# Patient Record
Sex: Male | Born: 1960 | Race: White | Hispanic: No | Marital: Married | Smoking: Never smoker
Health system: Southern US, Community
[De-identification: ages and names within clinical notes are randomized; demographics above are authoritative.]

## PROBLEM LIST (undated history)

## (undated) DIAGNOSIS — IMO0002 Reserved for concepts with insufficient information to code with codable children: Secondary | ICD-10-CM

## (undated) DIAGNOSIS — Z9109 Other allergy status, other than to drugs and biological substances: Secondary | ICD-10-CM

## (undated) DIAGNOSIS — K219 Gastro-esophageal reflux disease without esophagitis: Secondary | ICD-10-CM

## (undated) DIAGNOSIS — E785 Hyperlipidemia, unspecified: Secondary | ICD-10-CM

## (undated) DIAGNOSIS — J189 Pneumonia, unspecified organism: Secondary | ICD-10-CM

## (undated) HISTORY — DX: Hyperlipidemia, unspecified: E78.5

## (undated) HISTORY — PX: ELBOW SURGERY: SHX618

## (undated) HISTORY — DX: Gastro-esophageal reflux disease without esophagitis: K21.9

## (undated) HISTORY — DX: Other allergy status, other than to drugs and biological substances: Z91.09

## (undated) HISTORY — PX: INCISE AND DRAIN ABCESS: PRO64

## (undated) HISTORY — PX: CYSTECTOMY: SUR359

## (undated) HISTORY — DX: Reserved for concepts with insufficient information to code with codable children: IMO0002

---

## 2008-02-21 ENCOUNTER — Ambulatory Visit (HOSPITAL_COMMUNITY): Admission: RE | Admit: 2008-02-21 | Discharge: 2008-02-21 | Payer: Self-pay | Admitting: Internal Medicine

## 2008-05-01 ENCOUNTER — Encounter: Admission: RE | Admit: 2008-05-01 | Discharge: 2008-05-01 | Payer: Self-pay | Admitting: Internal Medicine

## 2010-07-22 ENCOUNTER — Encounter: Payer: Self-pay | Admitting: Internal Medicine

## 2011-10-10 ENCOUNTER — Ambulatory Visit (INDEPENDENT_AMBULATORY_CARE_PROVIDER_SITE_OTHER): Payer: Managed Care, Other (non HMO) | Admitting: General Surgery

## 2011-10-10 ENCOUNTER — Encounter (INDEPENDENT_AMBULATORY_CARE_PROVIDER_SITE_OTHER): Payer: Self-pay | Admitting: General Surgery

## 2011-10-10 VITALS — BP 122/80 | HR 84 | Temp 98.2°F | Resp 12 | Ht 70.5 in | Wt 186.2 lb

## 2011-10-10 DIAGNOSIS — D215 Benign neoplasm of connective and other soft tissue of pelvis: Secondary | ICD-10-CM | POA: Insufficient documentation

## 2011-10-10 NOTE — Patient Instructions (Signed)
Call if the area becomes infected prior to your surgery.

## 2011-10-10 NOTE — Progress Notes (Signed)
Patient ID: Joel Rich, male   DOB: [birth year], 51 y.o.   MRN: [id number]  Chief Complaint  Patient presents with  . Cyst    new pt- eval Lt posterior cyst    HPI Joel Rich is a 51 y.o. male.   HPI  He is referred by Dr. Clelia Croft for evaluation of a recurrent soft tissue mass of the left buttock.  In 2005, he had a cyst removed from this area in [place], [place].  He noticed it starting to come back and recently it became infected. Dr. Clelia Croft did an incision and drainage and treated him for this. It has been getting larger.  It does not cause him pain.  Past Medical History  Diagnosis Date  . Cyst     left posterior   . Hyperlipidemia     Past Surgical History  Procedure Date  . Incise and drain abcess     cyst on buttock  . Cystectomy     buttock    Family History  Problem Relation Age of Onset  . Cancer Mother     breast  . Cancer Maternal Grandmother     breast    Social History History  Substance Use Topics  . Smoking status: Never Smoker   . Smokeless tobacco: Not on file  . Alcohol Use: Yes    No Known Allergies  Current Outpatient Prescriptions  Medication Sig Dispense Refill  . simvastatin (ZOCOR) 20 MG tablet daily.        Review of Systems Review of Systems  Respiratory: Negative.   Cardiovascular: Negative.   Musculoskeletal: Negative.   Hematological: Negative.     Blood pressure 122/80, pulse 84, temperature 98.2 F (36.8 C), temperature source Temporal, resp. rate 12, height 5' 10.5" (1.791 m), weight 186 lb 3.2 oz (84.46 kg).  Physical Exam Physical Exam  Constitutional: He appears well-developed and well-nourished. No distress.  HENT:  Head: Normocephalic and atraumatic.  Cardiovascular: Normal rate and regular rhythm.   Pulmonary/Chest: Effort normal and breath sounds normal.  Musculoskeletal:       There is a transverse left buttock scar present. In the lateral aspect of the scar there is a 5 cm x 5 cm soft tissue mass  that appears to be going at least 4-5 cm deep as well. There is no erythema or drainage from the mass. It is somewhat mobile and nontender.    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    Plan    I recommended removal of the recurrent soft tissue mass under general anesthesia as it may be quite extensive.  We discussed the procedure, risks, and aftercare.  The risks include but are not limited to bleeding, infection, wound healing problems, anesthesia, and recurrence. He seems to understand all this and agrees with the plan.       Aarib Pulido J 10/10/2011, 3:30 PM

## 2011-10-23 ENCOUNTER — Other Ambulatory Visit (INDEPENDENT_AMBULATORY_CARE_PROVIDER_SITE_OTHER): Payer: Self-pay | Admitting: General Surgery

## 2011-10-23 DIAGNOSIS — L723 Sebaceous cyst: Secondary | ICD-10-CM

## 2011-10-27 ENCOUNTER — Telehealth (INDEPENDENT_AMBULATORY_CARE_PROVIDER_SITE_OTHER): Payer: Self-pay

## 2011-10-27 NOTE — Telephone Encounter (Signed)
Pt given pathology results.  Scheduled for suture removal on 11/07/11, and po appt with Dr. Abbey Chatters on 11/28/11.

## 2011-11-07 ENCOUNTER — Ambulatory Visit (INDEPENDENT_AMBULATORY_CARE_PROVIDER_SITE_OTHER): Payer: Managed Care, Other (non HMO) | Admitting: General Surgery

## 2011-11-07 ENCOUNTER — Encounter (INDEPENDENT_AMBULATORY_CARE_PROVIDER_SITE_OTHER): Payer: Managed Care, Other (non HMO)

## 2011-11-07 DIAGNOSIS — Z9889 Other specified postprocedural states: Secondary | ICD-10-CM

## 2011-11-07 NOTE — Progress Notes (Signed)
Operation:  Excision of large soft tissue mass right buttock   Date:  October 23, 2011  Pathology: Benign inflamed epidermoid cyst  HPI: He is here for his first postoperative visit. He has had some drainage from the lower aspect of the wound. He has not had any significant pain   Physical Exam: Right buttock-wound is clean and intact with sutures. There is still some tension on the wound.  Assessment: Wound healing well but not ready to have sutures removed.  Plan: Shower and keep a dry dressing on the wound and do this daily.  Return visit one week for suture removal.

## 2011-11-07 NOTE — Patient Instructions (Signed)
Shower daily and apply a dry dressing to the wound.

## 2011-11-14 ENCOUNTER — Ambulatory Visit (INDEPENDENT_AMBULATORY_CARE_PROVIDER_SITE_OTHER): Payer: Managed Care, Other (non HMO)

## 2011-11-14 VITALS — BP 130/80 | HR 80 | Temp 98.5°F | Resp 18 | Ht 70.0 in | Wt 177.0 lb

## 2011-11-14 DIAGNOSIS — Z4802 Encounter for removal of sutures: Secondary | ICD-10-CM

## 2011-11-14 NOTE — Progress Notes (Signed)
Pt in office for suture rem. Wound clean,dry,healing well. Sutures removed. Steri strips and benzoin applied. Pt to call with any concerns.

## 2011-11-28 ENCOUNTER — Encounter (INDEPENDENT_AMBULATORY_CARE_PROVIDER_SITE_OTHER): Payer: Self-pay | Admitting: General Surgery

## 2011-11-28 ENCOUNTER — Ambulatory Visit (INDEPENDENT_AMBULATORY_CARE_PROVIDER_SITE_OTHER): Payer: Managed Care, Other (non HMO) | Admitting: General Surgery

## 2011-11-28 VITALS — BP 118/70 | HR 84 | Temp 98.0°F | Resp 18 | Ht 71.0 in | Wt 187.0 lb

## 2011-11-28 DIAGNOSIS — Z9889 Other specified postprocedural states: Secondary | ICD-10-CM

## 2011-11-28 NOTE — Progress Notes (Signed)
Operation:  Excision of large soft tissue mass right buttock   Date:  October 23, 2011  Pathology: Benign inflamed epidermoid cyst  HPI: He is here for another postop visit.  The wound feels a little firm to him.  Otherwise, he has no complaints. His sutures were removed on 5/17.     Physical Exam: Right buttock-wound is clean and intact, with a firm healing ridge present  Assessment:  Wound healing well.  Copy of his pathology was given to him demonstrating a benign epidermoid cyst was given to him.  Plan:  RTC prn.

## 2011-11-28 NOTE — Patient Instructions (Signed)
Call for any wound problems. 

## 2011-12-12 ENCOUNTER — Encounter (INDEPENDENT_AMBULATORY_CARE_PROVIDER_SITE_OTHER): Payer: Self-pay

## 2012-03-13 ENCOUNTER — Encounter (HOSPITAL_COMMUNITY): Payer: Self-pay | Admitting: *Deleted

## 2012-03-13 ENCOUNTER — Emergency Department (HOSPITAL_COMMUNITY): Payer: Managed Care, Other (non HMO)

## 2012-03-13 ENCOUNTER — Emergency Department (HOSPITAL_COMMUNITY)
Admission: EM | Admit: 2012-03-13 | Discharge: 2012-03-13 | Disposition: A | Discharge status: Home / Self Care | Payer: Managed Care, Other (non HMO) | Attending: Emergency Medicine | Admitting: Emergency Medicine

## 2012-03-13 DIAGNOSIS — Y92009 Unspecified place in unspecified non-institutional (private) residence as the place of occurrence of the external cause: Secondary | ICD-10-CM | POA: Insufficient documentation

## 2012-03-13 DIAGNOSIS — E785 Hyperlipidemia, unspecified: Secondary | ICD-10-CM | POA: Insufficient documentation

## 2012-03-13 DIAGNOSIS — X500XXA Overexertion from strenuous movement or load, initial encounter: Secondary | ICD-10-CM | POA: Insufficient documentation

## 2012-03-13 DIAGNOSIS — S82839A Other fracture of upper and lower end of unspecified fibula, initial encounter for closed fracture: Secondary | ICD-10-CM

## 2012-03-13 DIAGNOSIS — S82899A Other fracture of unspecified lower leg, initial encounter for closed fracture: Secondary | ICD-10-CM | POA: Insufficient documentation

## 2012-03-13 DIAGNOSIS — Z79899 Other long term (current) drug therapy: Secondary | ICD-10-CM | POA: Insufficient documentation

## 2012-03-13 DIAGNOSIS — Y9369 Activity, other involving other sports and athletics played as a team or group: Secondary | ICD-10-CM | POA: Insufficient documentation

## 2012-03-13 DIAGNOSIS — Z7982 Long term (current) use of aspirin: Secondary | ICD-10-CM | POA: Insufficient documentation

## 2012-03-13 MED ORDER — HYDROCODONE-ACETAMINOPHEN 5-325 MG PO TABS: 1.0000 | ORAL_TABLET | ORAL | Status: AC | PRN

## 2012-03-13 NOTE — ED Provider Notes (Signed)
History     CSN: [id number]  Arrival date & time 03/13/12  2003   First MD Initiated Contact with Patient 03/13/12 2213      Chief Complaint  Patient presents with  . Ankle Pain   HPI  History provided by the patient. Patient is a 51 year old male who presents with complaints of left ankle pain and injury. Patient states he was outside playing capture the flag with kids and other parents when he twisted left ankle. Patient states he inverted the ankle and has pain over lateral aspect. There is associated swelling. Pain is worse with pressure and walking. Patient did use an old pair of crutches to help get around. Patient denies any numbness or weakness in the foot. Denies any other injury.   Past Medical History  Diagnosis Date  . Cyst     left posterior   . Hyperlipidemia     Past Surgical History  Procedure Date  . Incise and drain abcess     cyst on buttock  . Cystectomy     buttock    Family History  Problem Relation Age of Onset  . Cancer Mother     breast  . Cancer Maternal Grandmother     breast    History  Substance Use Topics  . Smoking status: Never Smoker   . Smokeless tobacco: Not on file  . Alcohol Use: Yes      Review of Systems  HENT: Negative for neck pain.   Musculoskeletal: Negative for back pain.       Left ankle pain and swelling.  Neurological: Negative for weakness and numbness.    Allergies  Review of patient's allergies indicates no known allergies.  Home Medications   Current Outpatient Rx  Name Route Sig Dispense Refill  . ASPIRIN EC 81 MG PO TBEC Oral Take 81 mg by mouth daily.    Marland Kitchen FLUTICASONE PROPIONATE 50 MCG/ACT NA SUSP Nasal Place 1 spray into the nose daily as needed. For stuffiness (hay fever)    . ADULT MULTIVITAMIN W/MINERALS CH Oral Take 1 tablet by mouth daily. Monday thru Friday    . SIMVASTATIN 20 MG PO TABS Oral Take 20 mg by mouth daily.       BP 125/76  Pulse 78  Temp 97.8 F (36.6 C) (Oral)  Resp 18   SpO2 98%  Physical Exam  Nursing note and vitals reviewed. Constitutional: He is oriented to person, place, and time. He appears well-developed and well-nourished. No distress.  HENT:  Head: Normocephalic.  Neck: Normal range of motion.  Cardiovascular: Normal rate and regular rhythm.   Pulmonary/Chest: Effort normal and breath sounds normal. No respiratory distress. He has no wheezes. He has no rales.  Musculoskeletal: He exhibits edema and tenderness.       Reduced range of motion of left ankle secondary to pain and swelling. There is tenderness over lateral malleoli area and lateral anterior home. No tenderness over medial malleolus. No tenderness over proximal fifth metatarsal. Normal dorsal pedal pulses, movement in toes, sensation in toes and cap refill less than 2 seconds.  Neurological: He is alert and oriented to person, place, and time.  Skin: Skin is warm. No erythema.  Psychiatric: He has a normal mood and affect.    ED Course  Procedures   Dg Ankle Complete Left  03/13/2012  *RADIOLOGY REPORT*  Clinical Data: Left ankle pain and swelling following injury.  LEFT ANKLE COMPLETE - 3+ VIEW  Comparison: None  Findings: An  oblique fracture of the distal fibula is identified with 3.5 mm anterior displacement. No other fractures are identified. There is no evidence of subluxation or dislocation. The talus is unremarkable. Lateral soft tissue swelling is present.  IMPRESSION: Minimally displaced distal fibular fracture.   Original Report Authenticated By: Rosendo Gros, M.D.      1. Fracture of distal fibula       MDM  10:10PM patient seen and evaluated. X-rays with slightly displaced distal fibular fracture.  Patient discussed with attending physician. Patient placed in short posterior and stirrup leg swollen and provided crutches.      Angus Seller, [place] 03/14/12 2114

## 2012-03-13 NOTE — ED Notes (Signed)
The pt was playing outside earlier today .  He slid in the grass and twisted his lt ankle.  painful

## 2012-03-13 NOTE — Progress Notes (Signed)
Orthopedic Tech Progress Note Patient Details:  Joel Rich 1960-08-11 [id number]  Ortho Devices Type of Ortho Device: Crutches;Post (short) splint;Stirrup splint Splint Material: Fiberglass Ortho Device/Splint Location: left leg Ortho Device/Splint Interventions: Application   Joel Rich 03/13/2012, 10:54 PM

## 2012-03-15 NOTE — ED Provider Notes (Signed)
Medical screening examination/treatment/procedure(s) were performed by non-physician practitioner and as supervising physician I was immediately available for consultation/collaboration.  Tobin Chad, MD 03/15/12 [phone]

## 2013-02-28 HISTORY — PX: ANKLE FRACTURE SURGERY: SHX122

## 2014-11-17 ENCOUNTER — Ambulatory Visit (AMBULATORY_SURGERY_CENTER): Payer: Self-pay

## 2014-11-17 VITALS — Ht 70.5 in | Wt 188.8 lb

## 2014-11-17 DIAGNOSIS — Z1211 Encounter for screening for malignant neoplasm of colon: Secondary | ICD-10-CM

## 2014-11-17 MED ORDER — MOVIPREP 100 G PO SOLR: 1.0000 | Freq: Once | ORAL | Status: DC

## 2014-11-17 NOTE — Progress Notes (Signed)
No allergies to eggs or soy No diet/weight loss meds No home oxygen No past problems with anesthesia  Has email  Emmi instructions given for colonoscopy 

## 2014-12-01 ENCOUNTER — Ambulatory Visit (AMBULATORY_SURGERY_CENTER): Payer: Managed Care, Other (non HMO) | Admitting: Internal Medicine

## 2014-12-01 ENCOUNTER — Encounter: Payer: Self-pay | Admitting: Internal Medicine

## 2014-12-01 VITALS — BP 115/58 | HR 58 | Temp 97.2°F | Resp 19 | Ht 70.0 in | Wt 188.0 lb

## 2014-12-01 DIAGNOSIS — Z1211 Encounter for screening for malignant neoplasm of colon: Secondary | ICD-10-CM | POA: Diagnosis not present

## 2014-12-01 MED ORDER — SODIUM CHLORIDE 0.9 % IV SOLN: 500.0000 mL | INTRAVENOUS | Status: DC

## 2014-12-01 NOTE — Patient Instructions (Signed)
YOU HAD AN ENDOSCOPIC PROCEDURE TODAY AT [facility]:   Refer to the procedure report that was given to you for any specific questions about what was found during the examination.  If the procedure report does not answer your questions, please call your gastroenterologist to clarify.  If you requested that your care partner not be given the details of your procedure findings, then the procedure report has been included in a sealed envelope for you to review at your convenience later.  YOU SHOULD EXPECT: Some feelings of bloating in the abdomen. Passage of more gas than usual.  Walking can help get rid of the air that was put into your GI tract during the procedure and reduce the bloating. If you had a lower endoscopy (such as a colonoscopy or flexible sigmoidoscopy) you may notice spotting of blood in your stool or on the toilet paper. If you underwent a bowel prep for your procedure, you may not have a normal bowel movement for a few days.  Please Note:  You might notice some irritation and congestion in your nose or some drainage.  This is from the oxygen used during your procedure.  There is no need for concern and it should clear up in a day or so.  SYMPTOMS TO REPORT IMMEDIATELY:   Following lower endoscopy (colonoscopy or flexible sigmoidoscopy):  Excessive amounts of blood in the stool  Significant tenderness or worsening of abdominal pains  Swelling of the abdomen that is new, acute  Fever of 100F or higher   For urgent or emergent issues, a gastroenterologist can be reached at any hour by calling [phone].   DIET: Your first meal following the procedure should be a small meal and then it is ok to progress to your normal diet. Heavy or fried foods are harder to digest and may make you feel nauseous or bloated.  Likewise, meals heavy in dairy and vegetables can increase bloating.  Drink plenty of fluids but you should avoid alcoholic beverages for 24  hours.  ACTIVITY:  You should plan to take it easy for the rest of today and you should NOT DRIVE or use heavy machinery until tomorrow (because of the sedation medicines used during the test).    FOLLOW UP: Our staff will call the number listed on your records the next business day following your procedure to check on you and address any questions or concerns that you may have regarding the information given to you following your procedure. If we do not reach you, we will leave a message.  However, if you are feeling well and you are not experiencing any problems, there is no need to return our call.  We will assume that you have returned to your regular daily activities without incident.  If any biopsies were taken you will be contacted by phone or by letter within the next 1-3 weeks.  Please call us at [phone] if you have not heard about the biopsies in 3 weeks.    SIGNATURES/CONFIDENTIALITY: You and/or your care partner have signed paperwork which will be entered into your electronic medical record.  These signatures attest to the fact that that the information above on your After Visit Summary has been reviewed and is understood.  Full responsibility of the confidentiality of this discharge information lies with you and/or your care-partner.  Normal colonoscopy. Repeat colonoscopy in 10 years.

## 2014-12-01 NOTE — Op Note (Signed)
[place]  [address] [place], [id number]   COLONOSCOPY PROCEDURE REPORT  PATIENT: Joel Rich, Joel Rich  MR#: [id number] BIRTHDATE: [birth year] , 84  yrs. old GENDER: male ENDOSCOPIST: Eustace Quail, MD REFERRED BY:W.  Lutricia Feil, M.D. PROCEDURE DATE:  12/01/2014 PROCEDURE:   Colonoscopy, screening First Screening Colonoscopy - Avg.  risk and is 50 yrs.  old or older - No.  Prior Negative Screening - Now for repeat screening. 10 or more years since last screening  History of Adenoma - Now for follow-up colonoscopy & has been > or = to 3 yrs.  N/A  Polyps removed today? No Recommend repeat exam, <10 yrs? No ASA CLASS:   Class II INDICATIONS:Screening for colonic neoplasia and Colorectal Neoplasm Risk Assessment for this procedure is average risk.. Prior examination in [facility] October 2010. Evaluating IBS type symptoms at that time. Normal exam including intubation of the ileum. MEDICATIONS: Monitored anesthesia care and Propofol 200 mg IV  DESCRIPTION OF PROCEDURE:   After the risks benefits and alternatives of the procedure were thoroughly explained, informed consent was obtained.  The digital rectal exam revealed no abnormalities of the rectum.   The LB [id number]  endoscope was introduced through the anus and advanced to the cecum, which was identified by both the appendix and ileocecal valve. No adverse events experienced.   The quality of the prep was excellent. (MoviPrep was used)  The instrument was then slowly withdrawn as the colon was fully examined. Estimated blood loss is zero unless otherwise noted in this procedure report.      COLON FINDINGS: A normal appearing cecum, ileocecal valve, and appendiceal orifice were identified.  The ascending, transverse, descending, sigmoid colon, and rectum appeared unremarkable. Retroflexed views revealed no abnormalities. The time to cecum = 3.4 Withdrawal time = 8.4   The scope was withdrawn and  the procedure completed.  COMPLICATIONS: There were no immediate complications.  ENDOSCOPIC IMPRESSION: 1. Normal colonoscopy  RECOMMENDATIONS: 1. Continue current colorectal screening recommendations for "routine risk" patients with a repeat colonoscopy in 10 years.   NOTE: The patient reported to me a 3 year history of intermittent pill and solid food dysphagia. Intermittent reflux symptoms with dietary indiscretion. I strongly recommended upper endoscopy with possible esophageal dilation. Patient understands and is free to schedule the exam, if interested, at his nearest convenience.  eSigned:  Eustace Quail, MD 12/01/2014 9:15 AM   cc: The Patient and W.  Lutricia Feil, MD

## 2014-12-01 NOTE — Progress Notes (Signed)
Report to PACU, RN, vss, BBS= Clear.  

## 2014-12-04 ENCOUNTER — Telehealth: Payer: Self-pay | Admitting: *Deleted

## 2014-12-04 NOTE — Telephone Encounter (Signed)
  Follow up Call-  Call back number 12/01/2014  Post procedure Call Back phone  # [phone]  Permission to leave phone message Yes     Patient questions:  Do you have a fever, pain , or abdominal swelling? No. Pain Score  0 *  Have you tolerated food without any problems? Yes.    Have you been able to return to your normal activities? Yes.    Do you have any questions about your discharge instructions: Diet   No. Medications  No. Follow up visit  No.  Do you have questions or concerns about your Care? No.  Actions: * If pain score is 4 or above: No action needed, pain <4.

## 2019-03-08 ENCOUNTER — Ambulatory Visit
Admission: RE | Admit: 2019-03-08 | Discharge: 2019-03-08 | Disposition: A | Discharge status: Home / Self Care | Payer: 59 | Source: Ambulatory Visit | Attending: Orthopedic Surgery | Admitting: Orthopedic Surgery

## 2019-03-08 ENCOUNTER — Other Ambulatory Visit: Payer: Self-pay | Admitting: Orthopedic Surgery

## 2019-03-08 DIAGNOSIS — S52122S Displaced fracture of head of left radius, sequela: Secondary | ICD-10-CM

## 2019-03-08 DIAGNOSIS — S52122A Displaced fracture of head of left radius, initial encounter for closed fracture: Secondary | ICD-10-CM

## 2020-10-20 IMAGING — CT CT ELBOW*L* W/O CM
1 series · 12 of 14 positions shown, 15 images · non-contrast
Comparison: None.

CLINICAL DATA: Fractures of the left radial head and coronoid
process of the proximal ulna.

EXAM:
CT OF THE UPPER LEFT EXTREMITY WITHOUT CONTRAST
TECHNIQUE: Multidetector CT imaging of the upper left extremity was performed
according to the standard protocol.

[Series 3: ext soft · axial · 0.26mm/px · z∈[+42,+196]mm · 12 of 91 slices shown, 15 images]
[im 7/91  soft-tissue]
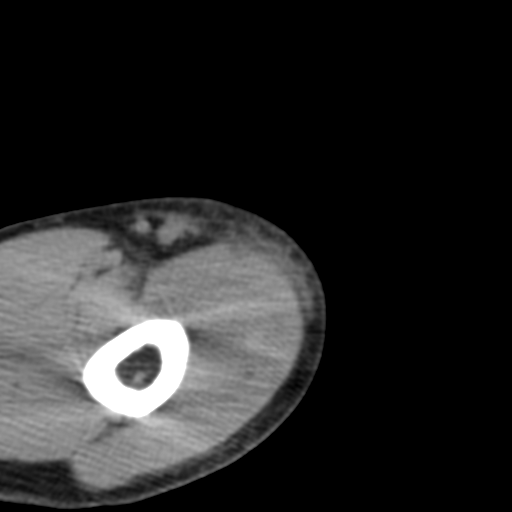
[im 7/91  bone]
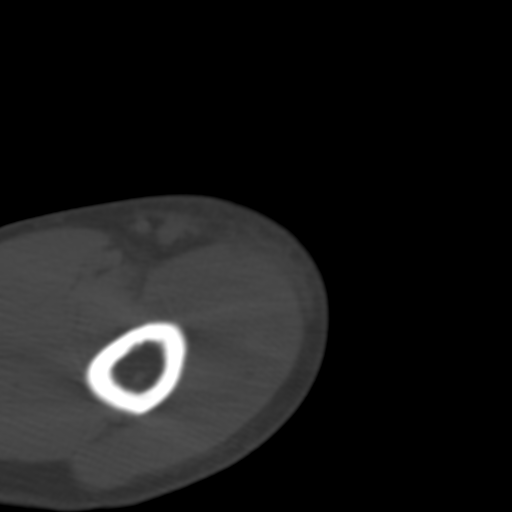
[im 14/91  bone]
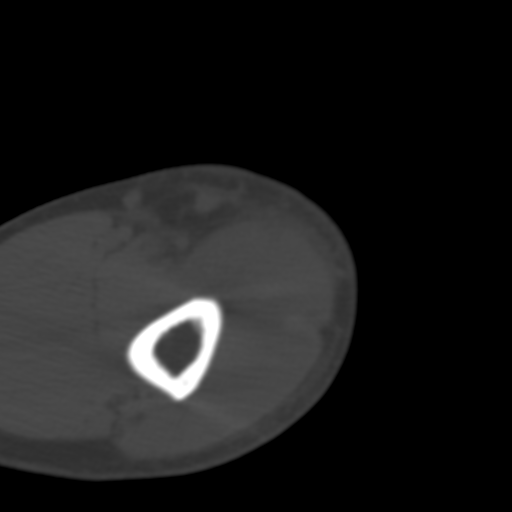
[im 21/91  bone]
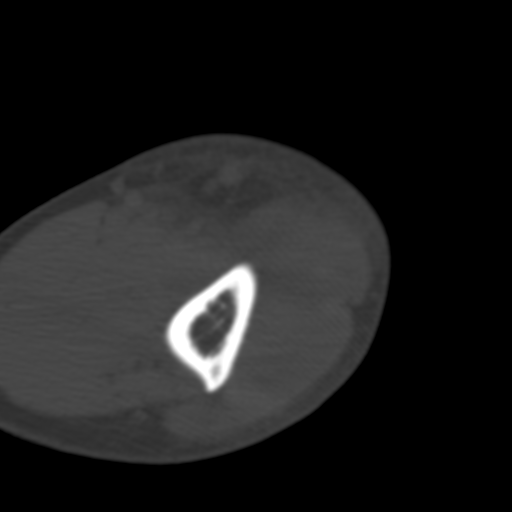
[im 28/91  bone]
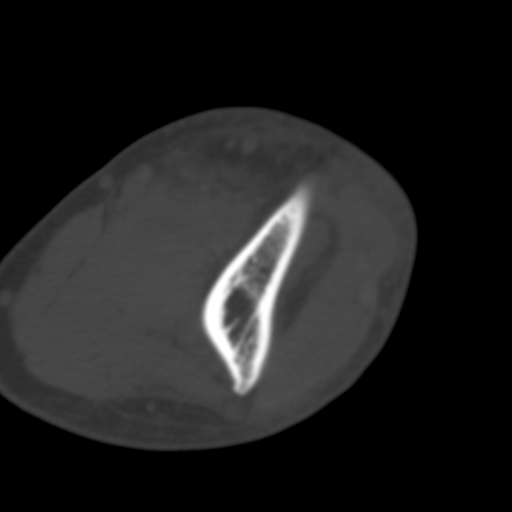
[im 35/91  soft-tissue]
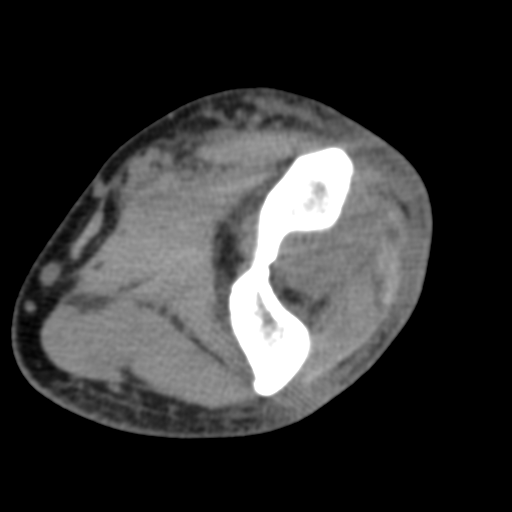
[im 35/91  bone]
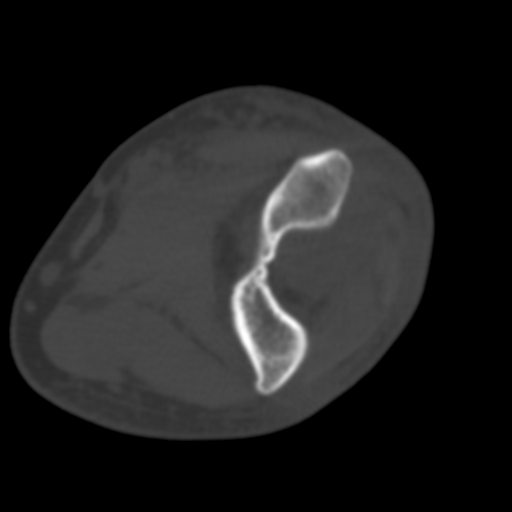
[im 42/91  bone]
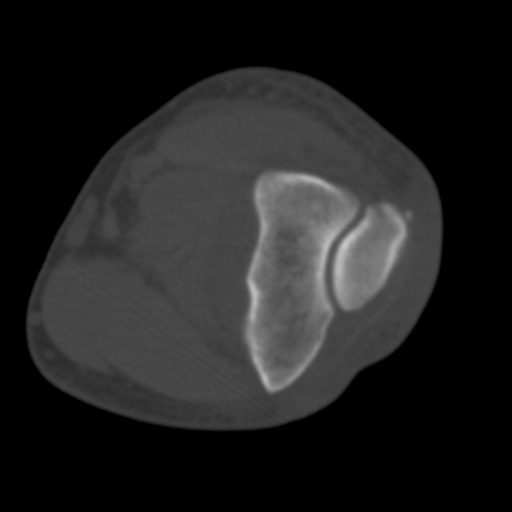
[im 49/91  bone]
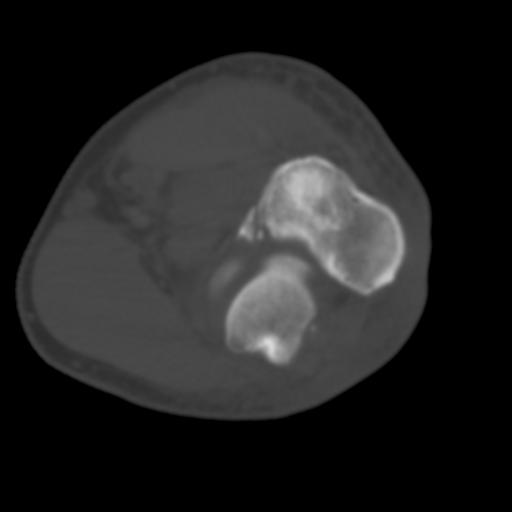
[im 56/91  bone]
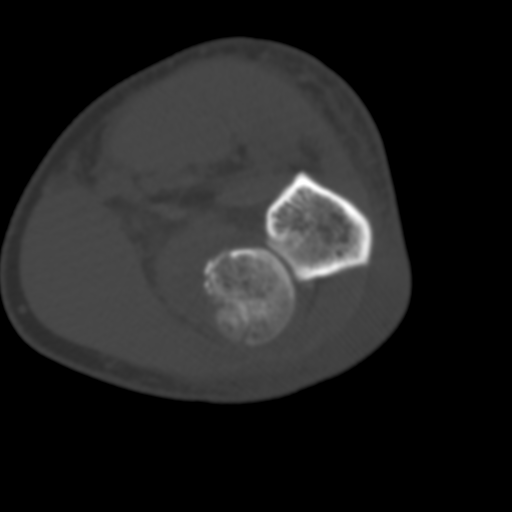
[im 63/91  soft-tissue]
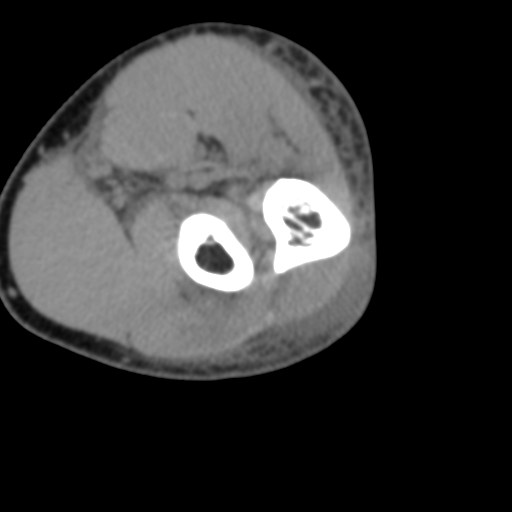
[im 63/91  bone]
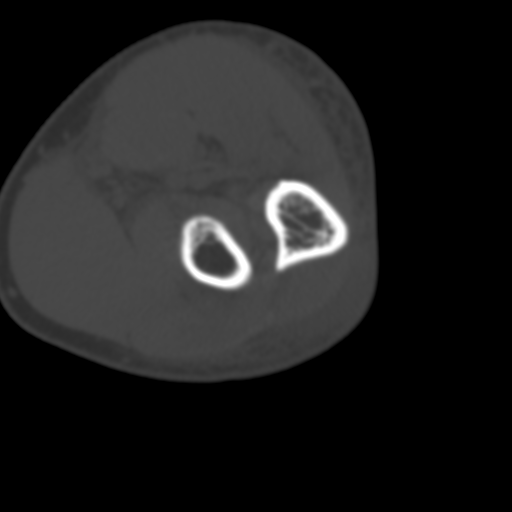
[im 70/91  bone]
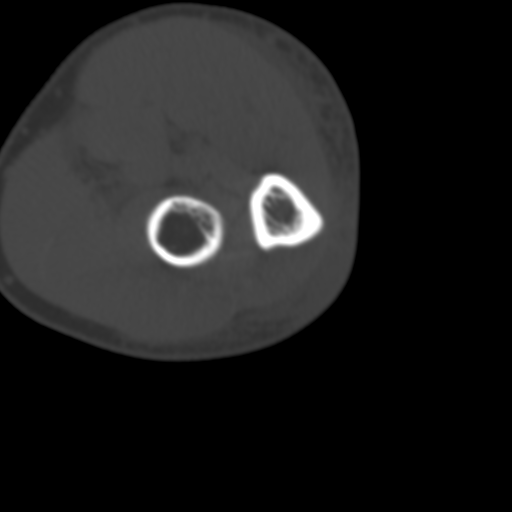
[im 77/91  bone]
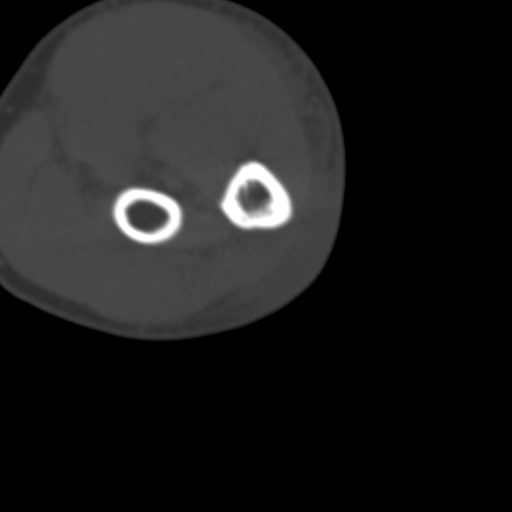
[im 84/91  bone]
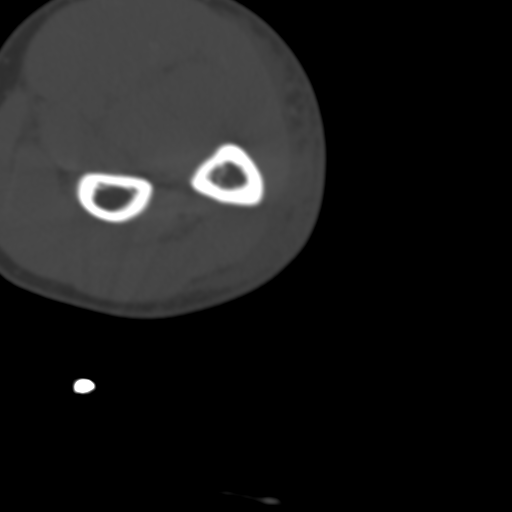

[12 of 14 positions shown; findings below may reference images not displayed]

FINDINGS: Bones/Joint/Cartilage

There is a minimally comminuted slightly impacted fracture of the
radial head involving the articular surface. There is a small
impacted fragment of the articular surface the maximum impaction of
1.5 mm.

There are small avulsed fragments from the posterior aspect of the
capitellum of the distal humerus.

There is a fragmented avulsion of the tip of the coronoid process of
the proximal ulna with minimal distraction.

Joint effusion.

Ligaments

Suboptimally assessed by CT. The ligaments are not adequately
visualized for evaluation.

Muscles and Tendons

No acute abnormalities.

Soft tissues

Soft tissue edema around the posterior aspect of the elbow.
IMPRESSION: 1. Comminuted slightly impacted fracture of the radial head
involving the articular surface.
2. Small avulsed fragments from the posterior aspect of the
capitellum of the distal humerus.
3. Avulsion fracture of the tip of the coronoid process of the
proximal ulna with minimal distraction.

## 2022-09-11 ENCOUNTER — Other Ambulatory Visit: Payer: Self-pay | Admitting: Internal Medicine

## 2022-09-11 ENCOUNTER — Ambulatory Visit
Admission: RE | Admit: 2022-09-11 | Discharge: 2022-09-11 | Disposition: A | Discharge status: Home / Self Care | Payer: 59 | Source: Ambulatory Visit | Attending: Internal Medicine | Admitting: Internal Medicine

## 2022-09-11 DIAGNOSIS — R1032 Left lower quadrant pain: Secondary | ICD-10-CM

## 2022-09-11 MED ORDER — IOPAMIDOL (ISOVUE-300) INJECTION 61%
100.0000 mL | Freq: Once | INTRAVENOUS | Status: AC | PRN
  Administered 2022-09-11: 100 mL via INTRAVENOUS

## 2022-10-02 ENCOUNTER — Other Ambulatory Visit: Payer: Self-pay | Admitting: Urology

## 2022-10-03 NOTE — Patient Instructions (Signed)
SURGICAL WAITING ROOM VISITATION  Patients having surgery or a procedure may have no more than 2 support people in the waiting area - these visitors may rotate.    Children under the age of 84 must have an adult with them who is not the patient.  Due to an increase in RSV and influenza rates and associated hospitalizations, children ages 62 and under may not visit patients in [facility] hospitals.  If the patient needs to stay at the hospital during part of their recovery, the visitor guidelines for inpatient rooms apply. Pre-op nurse will coordinate an appropriate time for 1 support person to accompany patient in pre-op.  This support person may not rotate.    Please refer to the [facility] website for the visitor guidelines for Inpatients (after your surgery is over and you are in a regular room).    Your procedure is scheduled on: 10/08/22   Report to [facility] Main Entrance    Report to admitting at 12:00 PM   Call this number if you have problems the morning of surgery [phone]   Do not eat food or drink liquids :After Midnight.          If you have questions, please contact your surgeon's office.   FOLLOW BOWEL PREP AND ANY ADDITIONAL PRE OP INSTRUCTIONS YOU RECEIVED FROM YOUR SURGEON'S OFFICE!!!     Oral Hygiene is also important to reduce your risk of infection.                                    Remember - BRUSH YOUR TEETH THE MORNING OF SURGERY WITH YOUR REGULAR TOOTHPASTE  DENTURES WILL BE REMOVED PRIOR TO SURGERY PLEASE DO NOT APPLY "Poly grip" OR ADHESIVES!!!   Take these medicines the morning of surgery with A SIP OF WATER: Simvastatin                               You may not have any metal on your body including jewelry, and body piercing             Do not wear lotions, powders, cologne, or deodorant  Do not shave  48 hours prior to surgery.               Men may shave face and neck.   Do not bring valuables to the hospital. [facility] IS  NOT             RESPONSIBLE   FOR VALUABLES.   Contacts, glasses, dentures or bridgework may not be worn into surgery.  DO NOT BRING YOUR HOME MEDICATIONS TO THE HOSPITAL. PHARMACY WILL DISPENSE MEDICATIONS LISTED ON YOUR MEDICATION LIST TO YOU DURING YOUR ADMISSION IN THE HOSPITAL!    Patients discharged on the day of surgery will not be allowed to drive home.  Someone NEEDS to stay with you for the first 24 hours after anesthesia.   Special Instructions: Bring a copy of your healthcare power of attorney and living will documents the day of surgery if you haven't scanned them before.              Please read over the following fact sheets you were given: IF YOU HAVE QUESTIONS ABOUT YOUR PRE-OP INSTRUCTIONS PLEASE CALL [phone] [place]ras    If you received a COVID test during your pre-op visit  it  is requested that you wear a mask when out in public, stay away from anyone that may not be feeling well and notify your surgeon if you develop symptoms. If you test positive for Covid or have been in contact with anyone that has tested positive in the last 10 days please notify you surgeon.    [place] - Preparing for Surgery Before surgery, you can play an important role.  Because skin is not sterile, your skin needs to be as free of germs as possible.  You can reduce the number of germs on your skin by washing with CHG (chlorahexidine gluconate) soap before surgery.  CHG is an antiseptic cleaner which kills germs and bonds with the skin to continue killing germs even after washing. Please DO NOT use if you have an allergy to CHG or antibacterial soaps.  If your skin becomes reddened/irritated stop using the CHG and inform your nurse when you arrive at Short Stay. Do not shave (including legs and underarms) for at least 48 hours prior to the first CHG shower.  You may shave your face/neck.  Please follow these instructions carefully:  1.  Shower with CHG Soap the night before surgery and the   morning of surgery.  2.  If you choose to wash your hair, wash your hair first as usual with your normal  shampoo.  3.  After you shampoo, rinse your hair and body thoroughly to remove the shampoo.                             4.  Use CHG as you would any other liquid soap.  You can apply chg directly to the skin and wash.  Gently with a scrungie or clean washcloth.  5.  Apply the CHG Soap to your body ONLY FROM THE NECK DOWN.   Do   not use on face/ open                           Wound or open sores. Avoid contact with eyes, ears mouth and   genitals (private parts).                       Wash face,  Genitals (private parts) with your normal soap.             6.  Wash thoroughly, paying special attention to the area where your    surgery  will be performed.  7.  Thoroughly rinse your body with warm water from the neck down.  8.  DO NOT shower/wash with your normal soap after using and rinsing off the CHG Soap.                9.  Pat yourself dry with a clean towel.            10.  Wear clean pajamas.            11.  Place clean sheets on your bed the night of your first shower and do not  sleep with pets. Day of Surgery : Do not apply any lotions/deodorants the morning of surgery.  Please wear clean clothes to the hospital/surgery center.  FAILURE TO FOLLOW THESE INSTRUCTIONS MAY RESULT IN THE CANCELLATION OF YOUR SURGERY  PATIENT SIGNATURE_________________________________  NURSE SIGNATURE__________________________________  ________________________________________________________________________

## 2022-10-03 NOTE — Progress Notes (Addendum)
COVID Vaccine Completed: yes  Date of COVID positive in last 63 days:no  PCP - Martha Clan, MD Cardiologist - n/a  Chest x-ray - n/a EKG -  n/a Stress Test - 20 years ago per pt ECHO - n/a Cardiac Cath - n/a Pacemaker/ICD device last checked: n/a Spinal Cord Stimulator: n/a  Bowel Prep - n/a  Sleep Study - n/a CPAP -   Fasting Blood Sugar - n/a Checks Blood Sugar _____ times a day  Last dose of GLP1 agonist-  N/A GLP1 instructions:  N/A   Last dose of SGLT-2 inhibitors-  N/A SGLT-2 instructions: N/A   Blood Thinner Instructions: n/a Aspirin Instructions: Last Dose:  Activity level: Can go up a flight of stairs and perform activities of daily living without stopping and without symptoms of chest pain or shortness of breath.    Anesthesia review:   Patient denies shortness of breath, fever, cough and chest pain at PAT appointment  Patient verbalized understanding of instructions that were given to them at the PAT appointment. Patient was also instructed that they will need to review over the PAT instructions again at home before surgery.

## 2022-10-03 NOTE — Progress Notes (Signed)
Please place orders for PAT appointment scheduled 11/05/22.

## 2022-10-06 ENCOUNTER — Encounter (HOSPITAL_COMMUNITY)
Admission: RE | Admit: 2022-10-06 | Discharge: 2022-10-06 | Disposition: A | Discharge status: Home / Self Care | Payer: 59 | Source: Ambulatory Visit | Attending: Urology | Admitting: Urology

## 2022-10-06 ENCOUNTER — Encounter (HOSPITAL_COMMUNITY): Payer: Self-pay

## 2022-10-06 ENCOUNTER — Other Ambulatory Visit: Payer: Self-pay

## 2022-10-06 VITALS — BP 109/75 | HR 70 | Temp 98.6°F | Resp 14 | Ht 70.5 in | Wt 187.0 lb

## 2022-10-06 DIAGNOSIS — Z01812 Encounter for preprocedural laboratory examination: Secondary | ICD-10-CM | POA: Diagnosis present

## 2022-10-06 DIAGNOSIS — Z01818 Encounter for other preprocedural examination: Secondary | ICD-10-CM

## 2022-10-06 HISTORY — DX: Pneumonia, unspecified organism: J18.9

## 2022-10-06 LAB — CBC
HCT: 42.4 % (ref 39.0–52.0)
Hemoglobin: 14 g/dL (ref 13.0–17.0)
MCH: 28.4 pg (ref 26.0–34.0)
MCHC: 33 g/dL (ref 30.0–36.0)
MCV: 86 fL (ref 80.0–100.0)
Platelets: 216 10*3/uL (ref 150–400)
RBC: 4.93 MIL/uL (ref 4.22–5.81)
RDW: 12.7 % (ref 11.5–15.5)
WBC: 4.8 10*3/uL (ref 4.0–10.5)
nRBC: 0 % (ref 0.0–0.2)

## 2022-10-08 ENCOUNTER — Ambulatory Visit (HOSPITAL_COMMUNITY): Payer: 59 | Admitting: Anesthesiology

## 2022-10-08 ENCOUNTER — Encounter (HOSPITAL_COMMUNITY)
Admission: RE | Disposition: A | Discharge status: Home / Self Care | Payer: Self-pay | Source: Home / Self Care | Attending: Urology

## 2022-10-08 ENCOUNTER — Encounter (HOSPITAL_COMMUNITY): Payer: Self-pay | Admitting: Urology

## 2022-10-08 ENCOUNTER — Other Ambulatory Visit: Payer: Self-pay

## 2022-10-08 ENCOUNTER — Ambulatory Visit (HOSPITAL_COMMUNITY)
Admission: RE | Admit: 2022-10-08 | Discharge: 2022-10-08 | Disposition: A | Discharge status: Home / Self Care | Payer: 59 | Attending: Urology | Admitting: Urology

## 2022-10-08 ENCOUNTER — Ambulatory Visit (HOSPITAL_BASED_OUTPATIENT_CLINIC_OR_DEPARTMENT_OTHER): Payer: 59 | Admitting: Anesthesiology

## 2022-10-08 DIAGNOSIS — D494 Neoplasm of unspecified behavior of bladder: Secondary | ICD-10-CM | POA: Diagnosis not present

## 2022-10-08 DIAGNOSIS — N35911 Unspecified urethral stricture, male, meatal: Secondary | ICD-10-CM | POA: Diagnosis not present

## 2022-10-08 SURGERY — TURBT, WITH CHEMOTHERAPEUTIC AGENT INSTILLATION INTO BLADDER
Anesthesia: General

## 2022-10-08 MED ORDER — DEXAMETHASONE SODIUM PHOSPHATE 10 MG/ML IJ SOLN
INTRAMUSCULAR | Status: AC
  Filled 2022-10-08: qty 1

## 2022-10-08 MED ORDER — ONDANSETRON HCL 4 MG/2ML IJ SOLN
INTRAMUSCULAR | Status: AC
  Filled 2022-10-08: qty 2

## 2022-10-08 MED ORDER — STERILE WATER FOR IRRIGATION IR SOLN
Status: DC | PRN
  Administered 2022-10-08: 1000 mL

## 2022-10-08 MED ORDER — LIDOCAINE 2% (20 MG/ML) 5 ML SYRINGE
INTRAMUSCULAR | Status: DC | PRN
  Administered 2022-10-08: 100 mg via INTRAVENOUS

## 2022-10-08 MED ORDER — SODIUM CHLORIDE 0.9 % IR SOLN
Status: DC | PRN
  Administered 2022-10-08: 3000 mL via INTRAVESICAL

## 2022-10-08 MED ORDER — HYDROMORPHONE HCL 1 MG/ML IJ SOLN
INTRAMUSCULAR | Status: AC
  Administered 2022-10-08: 0.5 mg via INTRAVENOUS
  Filled 2022-10-08: qty 2

## 2022-10-08 MED ORDER — MIDAZOLAM HCL 5 MG/5ML IJ SOLN
INTRAMUSCULAR | Status: DC | PRN
  Administered 2022-10-08: 2 mg via INTRAVENOUS

## 2022-10-08 MED ORDER — HYDROMORPHONE HCL 1 MG/ML IJ SOLN
0.2500 mg | INTRAMUSCULAR | Status: DC | PRN
  Administered 2022-10-08 (×2): 0.5 mg via INTRAVENOUS

## 2022-10-08 MED ORDER — OXYCODONE HCL 5 MG PO TABS: 5.0000 mg | ORAL_TABLET | Freq: Once | ORAL | Status: AC | PRN

## 2022-10-08 MED ORDER — CHLORHEXIDINE GLUCONATE 0.12 % MT SOLN
15.0000 mL | Freq: Once | OROMUCOSAL | Status: AC
  Administered 2022-10-08: 15 mL via OROMUCOSAL

## 2022-10-08 MED ORDER — PHENAZOPYRIDINE HCL 200 MG PO TABS: 200.0000 mg | ORAL_TABLET | Freq: Three times a day (TID) | ORAL | 0 refills | Status: AC | PRN

## 2022-10-08 MED ORDER — CEFAZOLIN SODIUM-DEXTROSE 2-4 GM/100ML-% IV SOLN
2.0000 g | INTRAVENOUS | Status: AC
  Administered 2022-10-08: 2 g via INTRAVENOUS
  Filled 2022-10-08: qty 100

## 2022-10-08 MED ORDER — ROCURONIUM BROMIDE 10 MG/ML (PF) SYRINGE
PREFILLED_SYRINGE | INTRAVENOUS | Status: AC
  Filled 2022-10-08: qty 10

## 2022-10-08 MED ORDER — ORAL CARE MOUTH RINSE: 15.0000 mL | Freq: Once | OROMUCOSAL | Status: AC

## 2022-10-08 MED ORDER — FENTANYL CITRATE (PF) 100 MCG/2ML IJ SOLN
INTRAMUSCULAR | Status: DC | PRN
  Administered 2022-10-08 (×2): 50 ug via INTRAVENOUS

## 2022-10-08 MED ORDER — LIDOCAINE HCL (PF) 2 % IJ SOLN
INTRAMUSCULAR | Status: AC
  Filled 2022-10-08: qty 5

## 2022-10-08 MED ORDER — DEXAMETHASONE SODIUM PHOSPHATE 10 MG/ML IJ SOLN
INTRAMUSCULAR | Status: DC | PRN
  Administered 2022-10-08: 10 mg via INTRAVENOUS

## 2022-10-08 MED ORDER — FENTANYL CITRATE (PF) 100 MCG/2ML IJ SOLN
INTRAMUSCULAR | Status: AC
  Filled 2022-10-08: qty 2

## 2022-10-08 MED ORDER — MEPERIDINE HCL 50 MG/ML IJ SOLN: 6.2500 mg | INTRAMUSCULAR | Status: DC | PRN

## 2022-10-08 MED ORDER — OXYCODONE HCL 5 MG PO TABS
ORAL_TABLET | ORAL | Status: AC
  Administered 2022-10-08: 5 mg via ORAL
  Filled 2022-10-08: qty 1

## 2022-10-08 MED ORDER — TRAMADOL HCL 50 MG PO TABS: 50.0000 mg | ORAL_TABLET | Freq: Four times a day (QID) | ORAL | 0 refills | Status: AC | PRN

## 2022-10-08 MED ORDER — GEMCITABINE CHEMO FOR BLADDER INSTILLATION 2000 MG
2000.0000 mg | Freq: Once | INTRAVENOUS | Status: AC
  Administered 2022-10-08: 2000 mg via INTRAVESICAL
  Filled 2022-10-08: qty 2000

## 2022-10-08 MED ORDER — LACTATED RINGERS IV SOLN: INTRAVENOUS | Status: DC

## 2022-10-08 MED ORDER — GEMCITABINE CHEMO FOR BLADDER INSTILLATION 2000 MG
2000.0000 mg | Freq: Once | INTRAVENOUS | Status: AC
  Filled 2022-10-08: qty 52.6

## 2022-10-08 MED ORDER — MIDAZOLAM HCL 2 MG/2ML IJ SOLN
INTRAMUSCULAR | Status: AC
  Filled 2022-10-08: qty 2

## 2022-10-08 MED ORDER — OXYCODONE HCL 5 MG/5ML PO SOLN: 5.0000 mg | Freq: Once | ORAL | Status: AC | PRN

## 2022-10-08 MED ORDER — AMISULPRIDE (ANTIEMETIC) 5 MG/2ML IV SOLN: 10.0000 mg | Freq: Once | INTRAVENOUS | Status: DC | PRN

## 2022-10-08 MED ORDER — PROPOFOL 10 MG/ML IV BOLUS
INTRAVENOUS | Status: DC | PRN
  Administered 2022-10-08: 150 mg via INTRAVENOUS

## 2022-10-08 MED ORDER — ROCURONIUM BROMIDE 10 MG/ML (PF) SYRINGE
PREFILLED_SYRINGE | INTRAVENOUS | Status: DC | PRN
  Administered 2022-10-08: 80 mg via INTRAVENOUS
  Administered 2022-10-08: 300 mg via INTRAVENOUS

## 2022-10-08 MED ORDER — OXYBUTYNIN CHLORIDE 5 MG PO TABS: 5.0000 mg | ORAL_TABLET | Freq: Three times a day (TID) | ORAL | 1 refills | Status: AC | PRN

## 2022-10-08 MED ORDER — PROMETHAZINE HCL 25 MG/ML IJ SOLN: 6.2500 mg | INTRAMUSCULAR | Status: DC | PRN

## 2022-10-08 MED ORDER — ONDANSETRON HCL 4 MG/2ML IJ SOLN
INTRAMUSCULAR | Status: DC | PRN
  Administered 2022-10-08: 4 mg via INTRAVENOUS

## 2022-10-08 SURGICAL SUPPLY — 19 items
BAG DRN RND TRDRP ANRFLXCHMBR (UROLOGICAL SUPPLIES) ×1
BAG URINE DRAIN 2000ML AR STRL (UROLOGICAL SUPPLIES) IMPLANT
BAG URO CATCHER STRL LF (MISCELLANEOUS) ×1 IMPLANT
CATH FOLEY 2WAY SLVR  5CC 18FR (CATHETERS) ×1
CATH FOLEY 2WAY SLVR 5CC 18FR (CATHETERS) IMPLANT
DRAPE FOOT SWITCH (DRAPES) ×1 IMPLANT
ELECT REM PT RETURN 15FT ADLT (MISCELLANEOUS) ×1 IMPLANT
GLOVE SURG LX STRL 7.5 STRW (GLOVE) ×1 IMPLANT
GOWN STRL REUS W/ TWL XL LVL3 (GOWN DISPOSABLE) ×1 IMPLANT
GOWN STRL REUS W/TWL XL LVL3 (GOWN DISPOSABLE) ×1
HOLDER FOLEY CATH W/STRAP (MISCELLANEOUS) IMPLANT
KIT TURNOVER KIT A (KITS) IMPLANT
LOOP CUT BIPOLAR 24F LRG (ELECTROSURGICAL) IMPLANT
MANIFOLD NEPTUNE II (INSTRUMENTS) ×1 IMPLANT
PACK CYSTO (CUSTOM PROCEDURE TRAY) ×1 IMPLANT
SYR TOOMEY IRRIG 70ML (MISCELLANEOUS)
SYRINGE TOOMEY IRRIG 70ML (MISCELLANEOUS) IMPLANT
TUBING CONNECTING 10 (TUBING) ×1 IMPLANT
TUBING UROLOGY SET (TUBING) ×1 IMPLANT

## 2022-10-08 NOTE — Anesthesia Preprocedure Evaluation (Signed)
Anesthesia Evaluation  Patient identified by MRN, date of birth, ID band Patient awake    Reviewed: Allergy & Precautions, H&P , NPO status , Patient's Chart, lab work & pertinent test results  Airway Mallampati: II  TM Distance: >3 FB Neck ROM: Full    Dental no notable dental hx.    Pulmonary neg pulmonary ROS   Pulmonary exam normal breath sounds clear to auscultation       Cardiovascular negative cardio ROS Normal cardiovascular exam Rhythm:Regular Rate:Normal     Neuro/Psych negative neurological ROS  negative psych ROS   GI/Hepatic Neg liver ROS,GERD  ,,  Endo/Other  negative endocrine ROS    Renal/GU negative Renal ROS  negative genitourinary   Musculoskeletal negative musculoskeletal ROS (+)    Abdominal   Peds negative pediatric ROS (+)  Hematology negative hematology ROS (+)   Anesthesia Other Findings   Reproductive/Obstetrics negative OB ROS                             Anesthesia Physical Anesthesia Plan  ASA: 2  Anesthesia Plan: General   Post-op Pain Management: Dilaudid IV   Induction: Intravenous  PONV Risk Score and Plan: 2 and Ondansetron, Midazolam and Treatment may vary due to age or medical condition  Airway Management Planned: LMA  Additional Equipment:   Intra-op Plan:   Post-operative Plan: Extubation in OR  Informed Consent: I have reviewed the patients History and Physical, chart, labs and discussed the procedure including the risks, benefits and alternatives for the proposed anesthesia with the patient or authorized representative who has indicated his/her understanding and acceptance.     Dental advisory given  Plan Discussed with: CRNA  Anesthesia Plan Comments:        Anesthesia Quick Evaluation

## 2022-10-08 NOTE — H&P (Signed)
Office Visit Report     10/02/2022   --------------------------------------------------------------------------------   Joel Rich  MRN: [id number]  DOB: 1960-09-14, 62 year old Male  SSN:    PRIMARY CARE:     REFERRING:  Lucius Conn, MD  PROVIDER:  Rhoderick Moody, M.D.  LOCATION:  [facility] [phone]     --------------------------------------------------------------------------------   CC/HPI: Bladder mass   Joel Rich is a 62 year old male with a 1 cm bladder mass seen on CT from 09/11/2022 during evaluation for left lower quadrant pain.   No prior history of GU surgery/trauma, infections or bothersome LUTS. Non-smoker. No personal/family history of kidney or bladder cancer. Works for an [employer], [employer]. No prior history hematuria.     ALLERGIES: None   MEDICATIONS: Simvastatin 20 mg tablet  Multivitamin  Vascepa  Vitamin B12     GU PSH: No GU PSH      PSH Notes: ankle surgery, elbow surgery   NON-GU PSH: No Non-GU PSH    GU PMH: None   NON-GU PMH: GERD Hypercholesterolemia    FAMILY HISTORY: 1 son - Son   SOCIAL HISTORY: Marital Status: Married Preferred Language: English; Ethnicity: Not Hispanic Or Latino; Race: White Current Smoking Status: Patient has never smoked.   Tobacco Use Assessment Completed: Used Tobacco in last 30 days? Has never drank.  Drinks 2 caffeinated drinks per day.    REVIEW OF SYSTEMS:    GU Review Male:   Patient reports frequent urination and get up at night to urinate. Patient denies hard to postpone urination, burning/ pain with urination, leakage of urine, stream starts and stops, trouble starting your stream, have to strain to urinate , erection problems, and penile pain.  Gastrointestinal (Upper):   Patient denies nausea, vomiting, and indigestion/ heartburn.  Gastrointestinal (Lower):   Patient denies diarrhea and constipation.  Constitutional:   Patient denies fever, night sweats,  weight loss, and fatigue.  Skin:   Patient denies skin rash/ lesion and itching.  Eyes:   Patient denies blurred vision and double vision.  Ears/ Nose/ Throat:   Patient denies sore throat and sinus problems.  Hematologic/Lymphatic:   Patient denies swollen glands and easy bruising.  Cardiovascular:   Patient denies leg swelling and chest pains.  Respiratory:   Patient denies cough and shortness of breath.  Endocrine:   Patient denies excessive thirst.  Musculoskeletal:   Patient denies back pain and joint pain.  Neurological:   Patient denies headaches and dizziness.  Psychologic:   Patient denies depression and anxiety.   VITAL SIGNS:      10/02/2022 11:41 AM  Weight 185 lb / 83.91 kg  Height 70.5 in / 179.07 cm  BP 107/68 mmHg  Pulse 69 /min  Temperature 98.0 F / 36.6 C  BMI 26.2 kg/m   GU PHYSICAL EXAMINATION:    Scrotum: No lesions. No edema. No cysts. No warts.  Epididymides: Right: no spermatocele, no masses, no cysts, no tenderness, no induration, no enlargement. Left: no spermatocele, no masses, no cysts, no tenderness, no induration, no enlargement.  Testes: No tenderness, no swelling, no enlargement left testes. No tenderness, no swelling, no enlargement right testes. Normal location left testes. Normal location right testes. No mass, no cyst, no varicocele, no hydrocele left testes. No mass, no cyst, no varicocele, no hydrocele right testes.  Urethral Meatus: Normal size. No lesion, no wart, no discharge, no polyp. Normal location.  Penis: Circumcised, no warts, no cracks. No dorsal Peyronie's plaques, no  left corporal Peyronie's plaques, no right corporal Peyronie's plaques, no scarring, no warts. No balanitis, no meatal stenosis.   MULTI-SYSTEM PHYSICAL EXAMINATION:    Constitutional: Well-nourished. No physical deformities. Normally developed. Good grooming.  Neurologic / Psychiatric: Oriented to time, oriented to place, oriented to person. No depression, no anxiety, no  agitation.     Complexity of Data:  Records Review:   Previous Patient Records  X-Ray Review: C.T. Abdomen/Pelvis: Reviewed Films. Reviewed Report. Discussed With Patient.    Notes:                     CLINICAL DATA: Left lower quadrant pain for 4 months   EXAM:  CT ABDOMEN AND PELVIS WITH CONTRAST   TECHNIQUE:  Multidetector CT imaging of the abdomen and pelvis was performed  using the standard protocol following bolus administration of  intravenous contrast.   RADIATION DOSE REDUCTION: This exam was performed according to the  departmental dose-optimization program which includes automated  exposure control, adjustment of the mA and/or kV according to  patient size and/or use of iterative reconstruction technique.   CONTRAST: ISOVUE-300 IOPAMIDOL (ISOVUE-300) INJECTION 61%   COMPARISON: CT 05/01/2008   FINDINGS:  Lower chest: There are multiple calcified and noncalcified pulmonary  nodules bilaterally. There is a 4 mm nodule in the periphery of the  right lower lobe which was not definitively present in 2009,  adjacent to a larger nodule (series 5, image 28). Other nodules  within the field of view a present on prior exam in 2009, are not  significantly changed, and are likely benign. No acute findings in  the lower chest. There is a small right basilar calcified pleural  plaque.   Hepatobiliary: No focal liver abnormality is seen. The gallbladder  is unremarkable.   Pancreas: Unremarkable. No pancreatic ductal dilatation or  surrounding inflammatory changes.   Spleen: Normal in size without focal abnormality.   Adrenals/Urinary Tract: Adrenal glands are unremarkable. No  hydronephrosis or nephroureterolithiasis. The bladder is minimally  distended. There is a soft tissue density nodule along the right  lateral wall measuring 1.0 x 0.9 x 0.7 cm (series 2, image 81,  series 4, image 61).   Stomach/Bowel: The stomach is within normal limits. There is no   evidence of bowel obstruction.The appendix is normal.   Vascular/Lymphatic: Scattered atherosclerosis. No AAA. There are  several prominent mesenteric lymph nodes, not pathologically  enlarged by size criteria.   Reproductive: Mildly enlarged prostate gland. Left-sided varicocele.   Other: Misty small bowel mesentery with a few prominent but not  pathologically enlarged mesenteric lymph nodes. No ascites. No free  air. No focal fluid collection.   Musculoskeletal: No acute osseous abnormality. No suspicious osseous  lesion. Multilevel degenerative changes of the spine. Mild  degenerative changes of the right and left hips.   IMPRESSION:  No acute findings in the abdomen or pelvis. Normal appendix.   Soft tissue density nodule along the right lateral wall of the  bladder measuring 1.0 x 0.9 x 0.7 cm. This could represent clot or a  bladder mass. Recommend correlation with urinalysis and urology  referral for further evaluation.   Misty small bowel mesentery with a few prominent but not  pathologically enlarged mesenteric lymph nodes. This is nonspecific  but can be seen in mesenteric panniculitis. No priors for  comparison. If the patient has persistent abdominal discomfort,  could consider a follow-up CT abdomen and pelvis in 6-12 months  assess for interval  change.   Left-sided varicocele. Mildly enlarged prostate gland.   Multiple calcified and noncalcified pulmonary nodules in the  visualized lung bases, the majority of which are unchanged from  prior chest CT in 2009. There is a 4 mm nodule in the periphery of  the right lower lobe which is not definitively present. No follow-up  needed if patient is low-risk. A 12 month follow-up noncontrast  chest CT can be considered in 12 months if the patient is considered  high-risk.This recommendation follows the consensus statement:  Guidelines for Management of Incidental Pulmonary Nodules Detected  on CT Images: From the  Fleischner Society 2017; Radiology 2017;  284:228-243.    Electronically Signed  By: Caprice RenshawJacob Kahn M.D.  On: 09/11/2022 11:37   PROCEDURES:         Flexible Cystoscopy - 52000  Risks, benefits, and some of the potential complications of the procedure were discussed at length with the patient including infection, bleeding, voiding discomfort, urinary retention, fever, chills, sepsis, and others. All questions were answered. Informed consent was obtained. Antibiotic prophylaxis was given. Sterile technique and intraurethral analgesia were used.  Meatus:  Normal size. Normal location. Normal condition.  Urethra:  No strictures.  External Sphincter:  Normal.  Verumontanum:  Normal.  Prostate:  Non-obstructing. No hyperplasia.  Bladder Neck:  Non-obstructing.  Ureteral Orifices:  Normal location. Normal size. Normal shape. Effluxed clear urine.  Bladder:  A right lateral wall tumor. 1 cm tumor. No trabeculation. Normal mucosa. No stones.      The lower urinary tract was carefully examined. The procedure was well-tolerated and without complications. Antibiotic instructions were given. Instructions were given to call the office immediately for bloody urine, difficulty urinating, urinary retention, painful or frequent urination, fever, chills, nausea, vomiting or other illness. The patient stated that he understood these instructions and would comply with them.   ASSESSMENT:      ICD-10 Details  1 GU:   Bladder tumor/neoplasm - D41.4 Undiagnosed New Problem   PLAN:           Schedule Return Visit/Planned Activity: Next Available Appointment - Schedule Surgery          Document Letter(s):  Created for Patient: Clinical Summary   Created for W Leola Brazilouglas Shaw Jr, MD         Notes:    -Cystoscopy today revealed a 1 cm papillary bladder tumor involving the right lateral wall adjacent to, but not directly involving the right ureteral orifice   The risks, benefits and alternatives of cystoscopy  with TURBT with gemcitabine instillation was discussed with the patient. The risks include, but are not limited to, bleeding, urinary tract infection, bladder perforation requiring prolonged catheterization and/or open bladder repair, ureteral obstruction, voiding dysfunction and the inherent risks of general anesthesia. The patient voices understanding and wishes to proceed.

## 2022-10-08 NOTE — Transfer of Care (Signed)
Immediate Anesthesia Transfer of Care Note  Patient: Thera Flake  Procedure(s) Performed: CYSTOSCOPY, TRANSURETHRAL RESECTION OF BLADDER TUMOR (TURBT) with GEMCITABINE INSTILLATION  Patient Location: PACU  Anesthesia Type:General  Level of Consciousness: sedated  Airway & Oxygen Therapy: Patient Spontanous Breathing and Patient connected to face mask oxygen  Post-op Assessment: Report given to RN and Post -op Vital signs reviewed and stable  Post vital signs: Reviewed and stable  Last Vitals:  Vitals Value Taken Time  BP 117/87 10/08/22 1441  Temp    Pulse 72 10/08/22 1443  Resp 12 10/08/22 1443  SpO2 100 % 10/08/22 1443  Vitals shown include unvalidated device data.  Last Pain:  Vitals:   10/08/22 1144  TempSrc:   PainSc: 0-No pain         Complications: No notable events documented.

## 2022-10-08 NOTE — Op Note (Signed)
Operative Note  Preoperative diagnosis:  1.  1 cm bladder tumor  Postoperative diagnosis: 1.  1 cm bladder tumor 2.  Meatal stenosis  Procedure(s): 1.  Cystoscopy with TURBT small 2.  Intravesical instillation of gemcitabine 3.  Urethral dilation  Surgeon: Rhoderick Moody, MD  Assistants:  None  Anesthesia:  General  Complications:  None  EBL: Less than 5 mL  Specimens: 1.  Bladder tumor  Drains/Catheters: 1.  18 French Foley catheter  Intraoperative findings:   1 cm papillary bladder tumor involving the right posterior bladder wall  Indication:  Joel Rich is a 62 y.o. 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  He has been consented for the above procedures, voices understanding and wishes to proceed.  Description of procedure:  After informed consent was obtained, the patient was brought to the operating room and general anesthesia was administered. The patient was then placed in the dorsolithotomy position and prepped and draped in the usual sterile fashion. A timeout was performed. A 23 French rigid cystoscope was then inserted into the urethral meatus and advanced into the bladder under direct vision. A complete bladder survey revealed his 1 cm papillary bladder tumor involving the right posterior bladder wall.  I attempted to place a 76 French resectoscope, but met resistance at the urethral meatus.  His meatus was then dilated with Sissy Hoff sounds starting at 22 [place] and advancing up to 26 [place], and 2 [place] increments.  I was then able to advance the 26 French resectoscope into the bladder.  His bladder tumor was then resected down to the detrusor musculature.  The bladder tumor fragments were then evacuated from the bladder through the sheath of the resectoscope and sent for pathologic analysis.  The area of resection was then extensively fulgurated until hemostasis was  achieved.  There was no evidence of bladder perforation.  The resectoscope was then removed.  An 65 French Foley catheter was placed with return of clear irrigant.  While in the recovery room 2000 mg of gemcitabine in 50 mL of water was instilled in the bladder through the catheter and the catheter was plugged. This will remain indwelling for approximately one hour. It will then be drained from the bladder and the catheter will be removed and the patient discharged home.  Plan: Follow-up in 1 to 2 weeks to discuss pathology results

## 2022-10-08 NOTE — Anesthesia Procedure Notes (Signed)
Procedure Name: Intubation Date/Time: 10/08/2022 1:57 PM  Performed by: Doran Clay, CRNAPre-anesthesia Checklist: Patient identified, Emergency Drugs available, Suction available, Patient being monitored and Timeout performed Patient Re-evaluated:Patient Re-evaluated prior to induction Oxygen Delivery Method: Circle system utilized Preoxygenation: Pre-oxygenation with 100% oxygen Induction Type: IV induction Ventilation: Mask ventilation without difficulty Laryngoscope Size: Mac and 4 Grade View: Grade I Tube type: Oral Tube size: 7.5 mm Number of attempts: 1 Airway Equipment and Method: Stylet Placement Confirmation: ETT inserted through vocal cords under direct vision, positive ETCO2 and breath sounds checked- equal and bilateral Secured at: 23 cm Tube secured with: Tape Dental Injury: Teeth and Oropharynx as per pre-operative assessment

## 2022-10-09 LAB — SURGICAL PATHOLOGY

## 2022-10-09 NOTE — Anesthesia Postprocedure Evaluation (Signed)
Anesthesia Post Note  Patient: Thera Flake  Procedure(s) Performed: CYSTOSCOPY, TRANSURETHRAL RESECTION OF BLADDER TUMOR (TURBT) with GEMCITABINE INSTILLATION     Patient location during evaluation: PACU Anesthesia Type: General Level of consciousness: awake and alert Pain management: pain level controlled Vital Signs Assessment: post-procedure vital signs reviewed and stable Respiratory status: spontaneous breathing, nonlabored ventilation, respiratory function stable and patient connected to nasal cannula oxygen Cardiovascular status: blood pressure returned to baseline and stable Postop Assessment: no apparent nausea or vomiting Anesthetic complications: no   No notable events documented.  Last Vitals:  Vitals:   10/08/22 1615 10/08/22 1635  BP: 124/83 (!) 134/90  Pulse: 71 79  Resp: 15 15  Temp:  (!) 36.4 C  SpO2: 96% 97%    Last Pain:  Vitals:   10/08/22 1635  TempSrc: Oral  PainSc: 2                  Jerusalem Wert S
